# Patient Record
Sex: Female | Born: 1970 | Hispanic: No | Marital: Married | Smoking: Never smoker
Health system: Southern US, Community
[De-identification: ages and names within clinical notes are randomized; demographics above are authoritative.]

---

## 2009-09-02 ENCOUNTER — Encounter (INDEPENDENT_AMBULATORY_CARE_PROVIDER_SITE_OTHER): Payer: Self-pay | Admitting: Family Medicine

## 2009-09-02 ENCOUNTER — Ambulatory Visit: Payer: Self-pay | Admitting: Family Medicine

## 2009-09-02 LAB — CONVERTED CEMR LAB
ALT: 8 units/L (ref 0–35)
AST: 15 units/L (ref 0–37)
BUN: 7 mg/dL (ref 6–23)
Basophils Absolute: 0 10*3/uL (ref 0.0–0.1)
Basophils Relative: 1 % (ref 0–1)
CO2: 23 meq/L (ref 19–32)
CRP: 0 mg/dL (ref <0.6)
Chloride: 105 meq/L (ref 96–112)
Creatinine, Ser: 0.66 mg/dL (ref 0.40–1.20)
Lymphocytes Relative: 49 % — ABNORMAL HIGH (ref 12–46)
Lymphs Abs: 3.1 10*3/uL (ref 0.7–4.0)
Monocytes Absolute: 0.5 10*3/uL (ref 0.1–1.0)
Monocytes Relative: 7 % (ref 3–12)
Neutro Abs: 2.7 10*3/uL (ref 1.7–7.7)
RDW: 13.3 % (ref 11.5–15.5)
Sed Rate: 3 mm/hr (ref 0–22)
Sodium: 141 meq/L (ref 135–145)
Total Bilirubin: 0.5 mg/dL (ref 0.3–1.2)
Total Protein: 7.8 g/dL (ref 6.0–8.3)
Uric Acid, Serum: 2 mg/dL — ABNORMAL LOW (ref 2.4–7.0)
hCG, Beta Chain, Quant, S: <2 milliintl units/mL

## 2009-09-06 ENCOUNTER — Ambulatory Visit (HOSPITAL_COMMUNITY): Admission: RE | Admit: 2009-09-06 | Discharge: 2009-09-06 | Payer: Self-pay | Admitting: Internal Medicine

## 2009-09-10 ENCOUNTER — Ambulatory Visit (HOSPITAL_COMMUNITY): Admission: RE | Admit: 2009-09-10 | Discharge: 2009-09-10 | Payer: Self-pay | Admitting: Family Medicine

## 2009-09-30 ENCOUNTER — Ambulatory Visit: Payer: Self-pay | Admitting: Internal Medicine

## 2009-10-21 ENCOUNTER — Ambulatory Visit: Payer: Self-pay | Admitting: Internal Medicine

## 2009-12-24 ENCOUNTER — Ambulatory Visit: Payer: Self-pay | Admitting: Family Medicine

## 2010-01-28 ENCOUNTER — Encounter (INDEPENDENT_AMBULATORY_CARE_PROVIDER_SITE_OTHER): Payer: Self-pay | Admitting: Family Medicine

## 2010-10-09 ENCOUNTER — Inpatient Hospital Stay (INDEPENDENT_AMBULATORY_CARE_PROVIDER_SITE_OTHER)
Admission: RE | Admit: 2010-10-09 | Discharge: 2010-10-09 | Disposition: A | Discharge status: Home / Self Care | Payer: Self-pay | Source: Ambulatory Visit | Attending: Family Medicine | Admitting: Family Medicine

## 2010-10-09 DIAGNOSIS — H1045 Other chronic allergic conjunctivitis: Secondary | ICD-10-CM

## 2014-03-13 ENCOUNTER — Encounter (HOSPITAL_COMMUNITY): Payer: Self-pay | Admitting: Family Medicine

## 2014-03-13 ENCOUNTER — Emergency Department (HOSPITAL_COMMUNITY)
Admission: EM | Admit: 2014-03-13 | Discharge: 2014-03-13 | Disposition: A | Discharge status: Home / Self Care | Payer: No Typology Code available for payment source | Source: Home / Self Care | Attending: Family Medicine | Admitting: Family Medicine

## 2014-03-13 ENCOUNTER — Ambulatory Visit (HOSPITAL_COMMUNITY): Payer: No Typology Code available for payment source | Attending: Family Medicine

## 2014-03-13 DIAGNOSIS — M542 Cervicalgia: Secondary | ICD-10-CM

## 2014-03-13 DIAGNOSIS — M5412 Radiculopathy, cervical region: Secondary | ICD-10-CM

## 2014-03-13 DIAGNOSIS — M79641 Pain in right hand: Secondary | ICD-10-CM | POA: Insufficient documentation

## 2014-03-13 DIAGNOSIS — S46811A Strain of other muscles, fascia and tendons at shoulder and upper arm level, right arm, initial encounter: Secondary | ICD-10-CM

## 2014-03-13 MED ORDER — PREDNISONE 10 MG PO KIT: PACK | ORAL | Status: AC

## 2014-03-13 MED ORDER — METHOCARBAMOL 500 MG PO TABS: 500.0000 mg | ORAL_TABLET | Freq: Four times a day (QID) | ORAL | Status: AC | PRN

## 2014-03-13 NOTE — ED Notes (Signed)
Pain in right neck, upper right back, and right hand numbness.  Patient reports a 3 month history of symptoms.  Has tried aleve and tylenol.  Patient has been to clinic on [address].  They offered medicine, but patient wants xrays, not more medicine.

## 2014-03-13 NOTE — Discharge Instructions (Signed)
Your pain is likely from cervical radiculopathy and trapezius muscle strain The nerves coming from the neck are irritated causing yoru symptoms Please start the rehab exercises for the neck and trapezius  Apply heat and massage daily Take the muscle relaxer for relief. This may make you tired Please take the prednisone for inflammation The steroids will also likely help you with your ear Follow up with your regular doctor in 2-4 weeks if youy are not better.  Cervical Strain and Sprain (Whiplash) with Rehab Cervical strain and sprain are injuries that commonly occur with "whiplash" injuries. Whiplash occurs when the neck is forcefully whipped backward or forward, such as during a motor vehicle accident or during contact sports. The muscles, ligaments, tendons, discs, and nerves of the neck are susceptible to injury when this occurs. RISK FACTORS Risk of having a whiplash injury increases if:  Osteoarthritis of the spine.  Situations that make head or neck accidents or trauma more likely.  High-risk sports (football, rugby, wrestling, hockey, auto racing, gymnastics, diving, contact karate, or boxing).  Poor strength and flexibility of the neck.  Previous neck injury.  Poor tackling technique.  Improperly fitted or padded equipment. SYMPTOMS   Pain or stiffness in the front or back of neck or both.  Symptoms may present immediately or up to 24 hours after injury.  Dizziness, headache, nausea, and vomiting.  Muscle spasm with soreness and stiffness in the neck.  Tenderness and swelling at the injury site. PREVENTION  Learn and use proper technique (avoid tackling with the head, spearing, and head-butting; use proper falling techniques to avoid landing on the head).  Warm up and stretch properly before activity.  Maintain physical fitness:  Strength, flexibility, and endurance.  Cardiovascular fitness.  Wear properly fitted and padded protective equipment, such as  padded soft collars, for participation in contact sports. PROGNOSIS  Recovery from cervical strain and sprain injuries is dependent on the extent of the injury. These injuries are usually curable in 1 week to 3 months with appropriate treatment.  RELATED COMPLICATIONS   Temporary numbness and weakness may occur if the nerve roots are damaged, and this may persist until the nerve has completely healed.  Chronic pain due to frequent recurrence of symptoms.  Prolonged healing, especially if activity is resumed too soon (before complete recovery). TREATMENT  Treatment initially involves the use of ice and medication to help reduce pain and inflammation. It is also important to perform strengthening and stretching exercises and modify activities that worsen symptoms so the injury does not get worse. These exercises may be performed at home or with a therapist. For patients who experience severe symptoms, a soft, padded collar may be recommended to be worn around the neck.  Improving your posture may help reduce symptoms. Posture improvement includes pulling your chin and abdomen in while sitting or standing. If you are sitting, sit in a firm chair with your buttocks against the back of the chair. While sleeping, try replacing your pillow with a small towel rolled to 2 inches in diameter, or use a cervical pillow or soft cervical collar. Poor sleeping positions delay healing.  For patients with nerve root damage, which causes numbness or weakness, the use of a cervical traction apparatus may be recommended. Surgery is rarely necessary for these injuries. However, cervical strain and sprains that are present at birth (congenital) may require surgery. MEDICATION   If pain medication is necessary, nonsteroidal anti-inflammatory medications, such as aspirin and ibuprofen, or other minor pain relievers,  such as acetaminophen, are often recommended.  Do not take pain medication for 7 days before  surgery.  Prescription pain relievers may be given if deemed necessary by your caregiver. Use only as directed and only as much as you need. HEAT AND COLD:   Cold treatment (icing) relieves pain and reduces inflammation. Cold treatment should be applied for 10 to 15 minutes every 2 to 3 hours for inflammation and pain and immediately after any activity that aggravates your symptoms. Use ice packs or an ice massage.  Heat treatment may be used prior to performing the stretching and strengthening activities prescribed by your caregiver, physical therapist, or athletic trainer. Use a heat pack or a warm soak. SEEK MEDICAL CARE IF:   Symptoms get worse or do not improve in 2 weeks despite treatment.  New, unexplained symptoms develop (drugs used in treatment may produce side effects). EXERCISES RANGE OF MOTION (ROM) AND STRETCHING EXERCISES - Cervical Strain and Sprain These exercises may help you when beginning to rehabilitate your injury. In order to successfully resolve your symptoms, you must improve your posture. These exercises are designed to help reduce the forward-head and rounded-shoulder posture which contributes to this condition. Your symptoms may resolve with or without further involvement from your physician, physical therapist or athletic trainer. While completing these exercises, remember:   Restoring tissue flexibility helps normal motion to return to the joints. This allows healthier, less painful movement and activity.  An effective stretch should be held for at least 20 seconds, although you may need to begin with shorter hold times for comfort.  A stretch should never be painful. You should only feel a gentle lengthening or release in the stretched tissue. STRETCH- Axial Extensors  Lie on your back on the floor. You may bend your knees for comfort. Place a rolled-up hand towel or dish towel, about 2 inches in diameter, under the part of your head that makes contact with the  floor.  Gently tuck your chin, as if trying to make a "double chin," until you feel a gentle stretch at the base of your head.  Hold __________ seconds. Repeat __________ times. Complete this exercise __________ times per day.  STRETCH - Axial Extension   Stand or sit on a firm surface. Assume a good posture: chest up, shoulders drawn back, abdominal muscles slightly tense, knees unlocked (if standing) and feet hip width apart.  Slowly retract your chin so your head slides back and your chin slightly lowers. Continue to look straight ahead.  You should feel a gentle stretch in the back of your head. Be certain not to feel an aggressive stretch since this can cause headaches later.  Hold for __________ seconds. Repeat __________ times. Complete this exercise __________ times per day. STRETCH - Cervical Side Bend   Stand or sit on a firm surface. Assume a good posture: chest up, shoulders drawn back, abdominal muscles slightly tense, knees unlocked (if standing) and feet hip width apart.  Without letting your nose or shoulders move, slowly tip your right / left ear to your shoulder until your feel a gentle stretch in the muscles on the opposite side of your neck.  Hold __________ seconds. Repeat __________ times. Complete this exercise __________ times per day. STRETCH - Cervical Rotators   Stand or sit on a firm surface. Assume a good posture: chest up, shoulders drawn back, abdominal muscles slightly tense, knees unlocked (if standing) and feet hip width apart.  Keeping your eyes level with the ground,  slowly turn your head until you feel a gentle stretch along the back and opposite side of your neck.  Hold __________ seconds. Repeat __________ times. Complete this exercise __________ times per day. RANGE OF MOTION - Neck Circles   Stand or sit on a firm surface. Assume a good posture: chest up, shoulders drawn back, abdominal muscles slightly tense, knees unlocked (if standing) and  feet hip width apart.  Gently roll your head down and around from the back of one shoulder to the back of the other. The motion should never be forced or painful.  Repeat the motion 10-20 times, or until you feel the neck muscles relax and loosen. Repeat __________ times. Complete the exercise __________ times per day. STRENGTHENING EXERCISES - Cervical Strain and Sprain These exercises may help you when beginning to rehabilitate your injury. They may resolve your symptoms with or without further involvement from your physician, physical therapist, or athletic trainer. While completing these exercises, remember:   Muscles can gain both the endurance and the strength needed for everyday activities through controlled exercises.  Complete these exercises as instructed by your physician, physical therapist, or athletic trainer. Progress the resistance and repetitions only as guided.  You may experience muscle soreness or fatigue, but the pain or discomfort you are trying to eliminate should never worsen during these exercises. If this pain does worsen, stop and make certain you are following the directions exactly. If the pain is still present after adjustments, discontinue the exercise until you can discuss the trouble with your clinician. STRENGTH - Cervical Flexors, Isometric  Face a wall, standing about 6 inches away. Place a small pillow, a ball about 6-8 inches in diameter, or a folded towel between your forehead and the wall.  Slightly tuck your chin and gently push your forehead into the soft object. Push only with mild to moderate intensity, building up tension gradually. Keep your jaw and forehead relaxed.  Hold 10 to 20 seconds. Keep your breathing relaxed.  Release the tension slowly. Relax your neck muscles completely before you start the next repetition. Repeat __________ times. Complete this exercise __________ times per day. STRENGTH- Cervical Lateral Flexors, Isometric   Stand  about 6 inches away from a wall. Place a small pillow, a ball about 6-8 inches in diameter, or a folded towel between the side of your head and the wall.  Slightly tuck your chin and gently tilt your head into the soft object. Push only with mild to moderate intensity, building up tension gradually. Keep your jaw and forehead relaxed.  Hold 10 to 20 seconds. Keep your breathing relaxed.  Release the tension slowly. Relax your neck muscles completely before you start the next repetition. Repeat __________ times. Complete this exercise __________ times per day. STRENGTH - Cervical Extensors, Isometric   Stand about 6 inches away from a wall. Place a small pillow, a ball about 6-8 inches in diameter, or a folded towel between the back of your head and the wall.  Slightly tuck your chin and gently tilt your head back into the soft object. Push only with mild to moderate intensity, building up tension gradually. Keep your jaw and forehead relaxed.  Hold 10 to 20 seconds. Keep your breathing relaxed.  Release the tension slowly. Relax your neck muscles completely before you start the next repetition. Repeat __________ times. Complete this exercise __________ times per day. POSTURE AND BODY MECHANICS CONSIDERATIONS - Cervical Strain and Sprain Keeping correct posture when sitting, standing or completing your  activities will reduce the stress put on different body tissues, allowing injured tissues a chance to heal and limiting painful experiences. The following are general guidelines for improved posture. Your physician or physical therapist will provide you with any instructions specific to your needs. While reading these guidelines, remember:  The exercises prescribed by your provider will help you have the flexibility and strength to maintain correct postures.  The correct posture provides the optimal environment for your joints to work. All of your joints have less wear and tear when properly  supported by a spine with good posture. This means you will experience a healthier, less painful body.  Correct posture must be practiced with all of your activities, especially prolonged sitting and standing. Correct posture is as important when doing repetitive low-stress activities (typing) as it is when doing a single heavy-load activity (lifting). PROLONGED STANDING WHILE SLIGHTLY LEANING FORWARD When completing a task that requires you to lean forward while standing in one place for a long time, place either foot up on a stationary 2- to 4-inch high object to help maintain the best posture. When both feet are on the ground, the low back tends to lose its slight inward curve. If this curve flattens (or becomes too large), then the back and your other joints will experience too much stress, fatigue more quickly, and can cause pain.  RESTING POSITIONS Consider which positions are most painful for you when choosing a resting position. If you have pain with flexion-based activities (sitting, bending, stooping, squatting), choose a position that allows you to rest in a less flexed posture. You would want to avoid curling into a fetal position on your side. If your pain worsens with extension-based activities (prolonged standing, working overhead), avoid resting in an extended position such as sleeping on your stomach. Most people will find more comfort when they rest with their spine in a more neutral position, neither too rounded nor too arched. Lying on a non-sagging bed on your side with a pillow between your knees, or on your back with a pillow under your knees will often provide some relief. Keep in mind, being in any one position for a prolonged period of time, no matter how correct your posture, can still lead to stiffness. WALKING Walk with an upright posture. Your ears, shoulders, and hips should all line up. OFFICE WORK When working at a desk, create an environment that supports good, upright  posture. Without extra support, muscles fatigue and lead to excessive strain on joints and other tissues. CHAIR:  A chair should be able to slide under your desk when your back makes contact with the back of the chair. This allows you to work closely.  The chair's height should allow your eyes to be level with the upper part of your monitor and your hands to be slightly lower than your elbows.  Body position:  Your feet should make contact with the floor. If this is not possible, use a foot rest.  Keep your ears over your shoulders. This will reduce stress on your neck and low back. Document Released: 04/13/2005 Document Revised: 08/28/2013 Document Reviewed: 07/26/2008 [facility] Patient Information 2015 [place], [place]. This information is not intended to replace advice given to you by your health care provider. Make sure you discuss any questions you have with your health care provider.  Trapezius Palsy  with Rehab The trapezius is a large muscle of the upper back that helps to control the shoulder blade (scapula) and stabilize the spine.  Trapezius palsy is a condition affecting the nervous system in the trapezius muscle. The condition results in pain and weakness in the back of the shoulder and upper back. Shoulder function is also decreased, because the scapula contains the socket for "ball and-socket" joint of the shoulder. Trapezius palsy is caused by injury to the spinal accessory nerve, which connects to the trapezius muscle. SYMPTOMS   Pain that is achy and in the shoulder and/or upper back.  Decreased shoulder function and/or strength.  Scapula protrudes (winging of the scapula).  Back pain when sitting in a chair with a hard back due to the scapula winging and placing pressure on the back of the chair.  Shrinkage (atrophy) of the trapezius muscle, this may or may not make the neckline look asymmetric.  Shoulder drooping. CAUSES  Trapezius palsy is caused by damage to the  spinal accessory nerve, which is connected to the trapezius muscle. This condition is often associated with acromioclavicular [facility](AC) or sternoclavicular subluxation (adjacent bones becoming out of proper alignment, but the joint surfaces are still touching). Common mechanisms of injury include:  Direct trauma to the shoulder (like being hit with an object or falling on the shoulder).  Complication of previous surgery that causes nerve damage. RISK INCREASES WITH:  Contact sports (i.e., football, rugby, or lacrosse).  Surgery around the neck.  Poor strength and flexibility. PREVENTION   Warm up and stretch properly before activity.  Maintain physical fitness:  Strength, flexibility, and endurance.  Cardiovascular fitness. PROGNOSIS  Trapezius palsy usually resolves spontaneously within 3 to 6 months. Nonsurgical (conservative) treatments may help decrease the severity of symptoms.  RELATED COMPLICATIONS   Permanent nerve damage, including pain, numbness, tingling, or weakness.  Inability to compete at a high level.  Recurrent symptoms that result in a chronic problem.  Shoulder stiffness. TREATMENT Treatment initially involves resting from any activities that aggravate the symptoms, and the use of ice and medications to help reduce pain and inflammation. The use of strengthening and stretching exercises may help reduce pain with activity. These exercises may be performed at home or with referral to a therapist. A therapist may recommend further treatment, such as:  The use of electric current to simulate the nerves (transcutaneous electronic nerve stimulation [TENS]).  Ultrasound. If symptoms are severe, your caregiver may recommend you wear a brace to decrease discomfort. If symptoms persist for more than 6 months despite nonsurgical treatment, surgery may be recommended (uncommon). MEDICATION   If pain medication is necessary, then nonsteroidal anti-inflammatory medications,  such as aspirin and ibuprofen, or other minor pain relievers, such as acetaminophen, are often recommended.  Do not take pain medication for 7 days before surgery.  Prescription pain relievers may be given if deemed necessary by your caregiver. Use only as directed and only as much as you need. HEAT AND COLD  Cold treatment (icing) relieves pain and reduces inflammation. Cold treatment should be applied for 10 to 15 minutes every 2 to 3 hours for inflammation and pain and immediately after any activity that aggravates your symptoms. Use ice packs or massage the area with a piece of ice (ice massage).  Heat treatment may be used prior to performing the stretching and strengthening activities prescribed by your caregiver, physical therapist, or athletic trainer. Use a heat pack or soak your injury in warm water. SEEK MEDICAL CARE IF:  Treatment seems to offer no benefit, or the condition worsens.  Any medications produce adverse side effects. EXERCISES RANGE OF MOTION (ROM) AND  STRETCHING EXERCISES - Trapezius Palsy (Spinal Accessory Nerve Palsy) These exercises may help you when beginning to rehabilitate your injury. Your symptoms may resolve with or without further involvement from your physician, physical therapist or athletic trainer. While completing these exercises, remember:   Restoring tissue flexibility helps normal motion to return to the joints. This allows healthier, less painful movement and activity.  An effective stretch should be held for at least 30 seconds.  A stretch should never be painful. You should only feel a gentle lengthening or release in the stretched tissue. STRETCH - Flexion, Standing  Stand with good posture. With an underhand grip on your right / left and an overhand grip on the opposite hand, grasp a broomstick or cane so that your hands are a little more than shoulder-width apart.  Keeping your right / left elbow straight and shoulder muscles relaxed, push  the stick with your opposite hand to raise your right / left arm in front of your body and then overhead. Raise your arm until you feel a stretch in your right / left shoulder, but before you have increased shoulder pain.  Try to avoid shrugging your right / left shoulder as your arm rises by keeping your shoulder blade tucked down and toward your mid-back spine. Hold __________ seconds.  Slowly return to the starting position. Repeat __________ times. Complete this exercise __________ times per day.  STRETCH - Abduction, Supine  Stand with good posture. With an underhand grip on your right / left and an overhand grip on the opposite hand, grasp a broomstick or cane so that your hands are a little more than shoulder-width apart.  Keeping your right / left elbow straight and shoulder muscles relaxed, push the stick with your opposite hand to raise your right / left arm out to the side of your body and then overhead. Raise your arm until you feel a stretch in your right / left shoulder, but before you have increased shoulder pain.  Try to avoid shrugging your right / left shoulder as your arm rises by keeping your shoulder blade tucked down and toward your mid-back spine. Hold __________ seconds.  Slowly return to the starting position. Repeat __________ times. Complete this exercise __________ times per day.  ROM - Flexion, Active-Assisted  Lie on your back. You may bend your knees for comfort.  Grasp a broomstick or cane so your hands are about shoulder-width apart. Your right / left hand should grip the end of the stick/cane so that your hand is positioned "thumbs-up," as if you were about to shake hands.  Using your healthy arm to lead, raise your right / left arm overhead until you feel a gentle stretch in your shoulder. Hold __________ seconds.  Use the stick/cane to assist in returning your right / left arm to its starting position. Repeat __________ times. Complete this exercise  __________ times per day.  STRETCH - External Rotation and Abduction  Stagger your stance through a doorframe. It does not matter which foot is forward.  Choose one of the following positions as instructed by your physician, physical therapist or athletic trainer: place your hands:  and forearms above your head and on the door frame.  and forearms at head-height and on the door frame.  at elbow-height and on the door frame.  Keeping your head and chest upright and your stomach muscles tight to prevent over-extending your low-back, slowly shift your weight onto your front foot until you feel a stretch across your chest  and/or in the front of your shoulders.  Hold __________ seconds. Shift your weight to your back foot to release the stretch. Repeat __________ times. Complete this stretch __________ times per day.  STRENGTHENING EXERCISES - Trapezius Palsy (Spinal Accessory Nerve Palsy) These exercises may help you when beginning to rehabilitate your injury. They may resolve your symptoms with or without further involvement from your physician, physical therapist or athletic trainer. While completing these exercises, remember:   Muscles can gain both the endurance and the strength needed for everyday activities through controlled exercises.  Complete these exercises as instructed by your physician, physical therapist or athletic trainer. Progress with the resistance and repetition exercises only as your caregiver advises.  You may experience muscle soreness or fatigue, but the pain or discomfort you are trying to eliminate should never worsen during these exercises. If this pain does worsen, stop and make certain you are following the directions exactly. If the pain is still present after adjustments, discontinue the exercise until you can discuss the trouble with your clinician. STRENGTH - Scapular Depression and Adduction  With good posture, sit on a firm chair. Support your arms in front  of you with pillows, arm rests or a table top. Have your elbows in line with the sides of your body.  Gently draw your shoulder blades down and toward your mid-back spine. Gradually increase the tension without tensing the muscles along the top of your shoulders and the back of your neck.  Hold for __________ seconds. Slowly release the tension and relax your muscles completely before completing the next repetition.  After you have practiced this exercise, remove the arm support and complete it while standing as well as sitting. Repeat __________ times. Complete this exercise __________ times per day.  STRENGTH - Shoulder Abductors, Isometric   With good posture, stand or sit about 4-6 inches from a wall with your right / left side facing the wall.  Bend your right / left elbow. Gently press your right / left elbow into the wall. Increase the pressure gradually until you are pressing as hard as you can without shrugging your shoulder or increasing any shoulder discomfort.  Hold __________ seconds.  Release the tension slowly. Relax your shoulder muscles completely before you do the next repetition. Repeat __________ times. Complete this exercise __________ times per day.  STRENGTH - Shoulder Flexion, Isometric  With good posture and facing a wall, stand or sit about 4-6 inches away.  Keeping your right / left elbow straight, gently press the top of your fist into the wall. Increase the pressure gradually until you are pressing as hard as you can without shrugging your shoulder or increasing any shoulder discomfort.  Hold __________ seconds.  Release the tension slowly. Relax your shoulder muscles completely before you do the next repetition. Repeat __________ times. Complete this exercise __________ times per day.  STRENGTH - Internal Rotators  Secure a rubber exercise band/tubing to a fixed object so that it is at the same height as your right / left elbow when you are standing or  sitting on a firm surface.  Stand or sit so that the secured exercise band/tubing is at your right / left side.  Bend your elbow 90 degrees. Place a folded towel or small pillow under your right / left arm so that your elbow is a few inches away from your side.  Keeping the tension on the exercise band/tubing, pull it across your body toward your abdomen. Be sure to keep your  body steady so that the movement is only coming from your shoulder rotating.  Hold __________ seconds. Release the tension in a controlled manner as you return to the starting position. Repeat __________ times. Complete this exercise __________ times per day.  STRENGTH - External Rotators  Secure a rubber exercise band/tubing to a fixed object so that it is at the same height as your right / left elbow when you are standing or sitting on a firm surface.  Stand or sit so that the secured exercise band/tubing is at your side that is not injured.  Bend your elbow 90 degrees. Place a folded towel or small pillow under your right / left arm so that your elbow is a few inches away from your side.  Keeping the tension on the exercise band/tubing, pull it away from your body, as if pivoting on your elbow. Be sure to keep your body steady so that the movement is only coming from your shoulder rotating.  Hold __________ seconds. Release the tension in a controlled manner as you return to the starting position. Repeat __________ times. Complete this exercise __________ times per day.  STRENGTH - Shoulder Extensors  Secure a rubber exercise band/tubing so that it is at the height of your shoulders when you are either standing or sitting on a firm arm-less chair.  With a thumbs-up grip, grasp an end of the band/tubing in each hand. Straighten your elbows and lift your hands straight in front of you at shoulder height. Step back away from the secured end of band/tubing until it becomes tense.  Squeezing your shoulder blades  together, pull your hands down to the sides of your thighs. Do not allow your hands to go behind you.  Hold for __________ seconds. Slowly ease the tension on the band/tubing as you reverse the directions and return to the starting position. Repeat __________ times. Complete this exercise __________ times per day.  STRENGTH - Shoulder Extensors, Prone  Lie on your stomach on a firm surface so that your right / left arm overhangs the edge. Rest your forehead on your opposite forearm. With your thumb facing away from your body and your elbow straight, hold a __________ weight in your hand.  Squeeze your right / left shoulder blade to your mid-back spine and then slowly raise your arm behind you to the height of the bed.  Hold for __________ seconds. Slowly reverse the directions and return to the starting position, controlling the weight as you lower your arm. Repeat __________ times. Complete this exercise __________ times per day.  STRENGTH - Horizontal Abductors Choose one of the two oppositions to complete this exercise. Prone (lying on stomach):  Lie on your stomach on a firm surface so that your right / left arm overhangs the edge. Rest your forehead on your opposite forearm. With your palm facing the floor and your elbow straight, hold a __________ weight in your hand.  Squeeze your right / left shoulder blade to your mid-back spine and then slowly raise your arm to the height of the bed.  Hold for __________ seconds. Slowly reverse the directions and return to the starting position, controlling the weight as you lower your arm. Repeat __________ times. Complete this exercise __________ times per day. Standing:   Secure a rubber exercise band/tubing so that it is at the height of your shoulders when you are either standing or sitting on a firm arm-less chair.  Grasp an end of the band/tubing in each hand and have  your palms face each other. Straighten your elbows and lift your hands  straight in front of you at shoulder height. Step back away from the secured end of band/tubing until it becomes tense.  Squeeze your shoulder blades together. Keeping your elbows locked and your hands at shoulder-height, bring your hands out to your side.  Hold __________ seconds. Slowly ease the tension on the band/tubing as you reverse the directions and return to the starting position. Repeat __________ times. Complete this exercise __________ times per day. STRENGTH - Scapular Retractors and Elevators  Secure a rubber exercise band/tubing so that it is at the height of your shoulders when you are either standing or sitting on a firm arm-less chair.  With a thumbs-up grip, grasp an end of the band/tubing in each hand. Step back away from the secured end of band/tubing until it becomes tense.  Squeezing your shoulder blades together, straighten your elbows and lift your hands straight over your head.  Hold for __________ seconds. Slowly ease the tension on the band/tubing as you reverse the directions and return to the starting position. Repeat __________ times. Complete this exercise __________ times per day.  Document Released: 04/13/2005 Document Revised: 08/28/2013 Document Reviewed: 07/26/2008 [facility] Patient Information 2015 [place], [place]. This information is not intended to replace advice given to you by your health care provider. Make sure you discuss any questions you have with your health care provider.

## 2014-03-13 NOTE — ED Provider Notes (Signed)
CSN: [id number]     Arrival date & time 03/13/14  1745 History   First MD Initiated Contact with Patient 03/13/14 1902     Chief Complaint  Patient presents with  . Neck Pain  . Arm Pain   (Consider location/radiation/quality/duration/timing/severity/associated sxs/prior Treatment) HPI  Developed R upper neck and shoulder pain 3-4 mo ago. Went to PCP office avbout 4 wks ago and told to use tylenol adn aleve w/ temporary benefit. Burning. Comes and goes. Arm movements and neck movements make is worse. No history of neck trauma. Pain radiates down R arm. Strength preserved.     History reviewed. No pertinent past medical history. History reviewed. No pertinent past surgical history. No family history on file. History  Substance Use Topics  . Smoking status: Never Smoker   . Smokeless tobacco: Not on file  . Alcohol Use: No   OB History    No data available     Review of Systems Per HPI with all other pertinent systems negative.   Allergies  Review of patient's allergies indicates no known allergies.  Home Medications   Prior to Admission medications   Medication Sig Start Date End Date Taking? Authorizing Provider  acetaminophen (TYLENOL) 325 MG tablet Take 650 mg by mouth every 6 (six) hours as needed.   Yes Historical Provider, MD  Naproxen Sodium (ALEVE PO) Take by mouth.   Yes Historical Provider, MD  methocarbamol (ROBAXIN) 500 MG tablet Take 1-2 tablets (500-1,000 mg total) by mouth every 6 (six) hours as needed for muscle spasms. 03/13/14   Waldemar Dickens, MD  PredniSONE 10 MG KIT 12 day dose pack 03/13/14   Waldemar Dickens, MD   BP 132/76 mmHg  Pulse 69  Temp(Src) 98.1 F (36.7 C) (Oral)  Resp 16  SpO2 100%  LMP 02/19/2014 Physical Exam  Constitutional: She is oriented to person, place, and time. She appears well-developed and well-nourished. No distress.  HENT:  Head: Normocephalic and atraumatic.  Eyes: EOM are normal. Pupils are equal, round, and  reactive to light.  Neck: Normal range of motion.  Cardiovascular: Normal rate and normal heart sounds.   No murmur heard. Pulmonary/Chest: Effort normal and breath sounds normal. No respiratory distress.  Abdominal: Soft.  Musculoskeletal:  Neck FROm Arms FROM No ttp Spurlings + on R Trapezius tight and ttp on R  Neurological: She is alert and oriented to person, place, and time.  Skin: Skin is warm. She is not diaphoretic.  Psychiatric: Her behavior is normal. Judgment and thought content normal.    ED Course  Procedures (including critical care time) Labs Review Labs Reviewed - No data to display  Imaging Review Dg Cervical Spine Complete  03/13/2014   CLINICAL DATA:  Right-sided neck pain and right hand pain for 3-4 weeks  EXAM: CERVICAL SPINE  4+ VIEWS  COMPARISON:  None.  FINDINGS: Seven cervical segments are well visualized. Mild osteophytic changes are noted at C5-6. The neural foramina are widely patent bilaterally. No acute fracture or acute facet abnormality is seen. The odontoid is within normal limits. No gross soft tissue abnormality is noted.  IMPRESSION: Mild cervical degenerative change.  No acute abnormality is noted.   Electronically Signed   By: Inez Catalina M.D.   On: 03/13/2014 19:54     MDM   1. Cervical radiculopathy   2. Neck pain   3. Trapezius strain, right, initial encounter    Plain film w/ only mild ddd Start neck exercises and trap  exercises Steroid pack Robaxin Heat and massage F/u PCP in 2-4 weeks  Precautions given and all questions answered  Linna Darner, MD Family Medicine 03/13/2014, 8:12 PM    Steroids robaxin  Waldemar Dickens, MD 03/13/14 2012

## 2014-05-25 ENCOUNTER — Other Ambulatory Visit: Payer: Self-pay

## 2014-05-25 DIAGNOSIS — Z1231 Encounter for screening mammogram for malignant neoplasm of breast: Secondary | ICD-10-CM

## 2014-05-30 ENCOUNTER — Other Ambulatory Visit: Payer: Self-pay

## 2014-05-30 ENCOUNTER — Ambulatory Visit
Admission: RE | Admit: 2014-05-30 | Discharge: 2014-05-30 | Disposition: A | Discharge status: Home / Self Care | Payer: No Typology Code available for payment source | Source: Ambulatory Visit

## 2014-05-30 DIAGNOSIS — Z1231 Encounter for screening mammogram for malignant neoplasm of breast: Secondary | ICD-10-CM

## 2015-07-23 ENCOUNTER — Other Ambulatory Visit: Payer: Self-pay

## 2015-07-23 DIAGNOSIS — Z1231 Encounter for screening mammogram for malignant neoplasm of breast: Secondary | ICD-10-CM

## 2015-07-31 ENCOUNTER — Ambulatory Visit
Admission: RE | Admit: 2015-07-31 | Discharge: 2015-07-31 | Disposition: A | Discharge status: Home / Self Care | Payer: BLUE CROSS/BLUE SHIELD | Source: Ambulatory Visit

## 2015-07-31 DIAGNOSIS — Z1231 Encounter for screening mammogram for malignant neoplasm of breast: Secondary | ICD-10-CM

## 2015-08-05 ENCOUNTER — Other Ambulatory Visit: Payer: Self-pay | Admitting: Family

## 2015-08-05 DIAGNOSIS — R928 Other abnormal and inconclusive findings on diagnostic imaging of breast: Secondary | ICD-10-CM

## 2015-08-12 ENCOUNTER — Ambulatory Visit
Admission: RE | Admit: 2015-08-12 | Discharge: 2015-08-12 | Disposition: A | Discharge status: Home / Self Care | Payer: BLUE CROSS/BLUE SHIELD | Source: Ambulatory Visit | Attending: Family | Admitting: Family

## 2015-08-12 DIAGNOSIS — R928 Other abnormal and inconclusive findings on diagnostic imaging of breast: Secondary | ICD-10-CM

## 2016-03-22 IMAGING — CR DG CERVICAL SPINE COMPLETE 4+V
5 series · 5 of 5 positions shown · non-contrast
Comparison: None.

CLINICAL DATA: Right-sided neck pain and right hand pain for 3-4
weeks

EXAM:
CERVICAL SPINE  4+ VIEWS

[w c-spine lat]
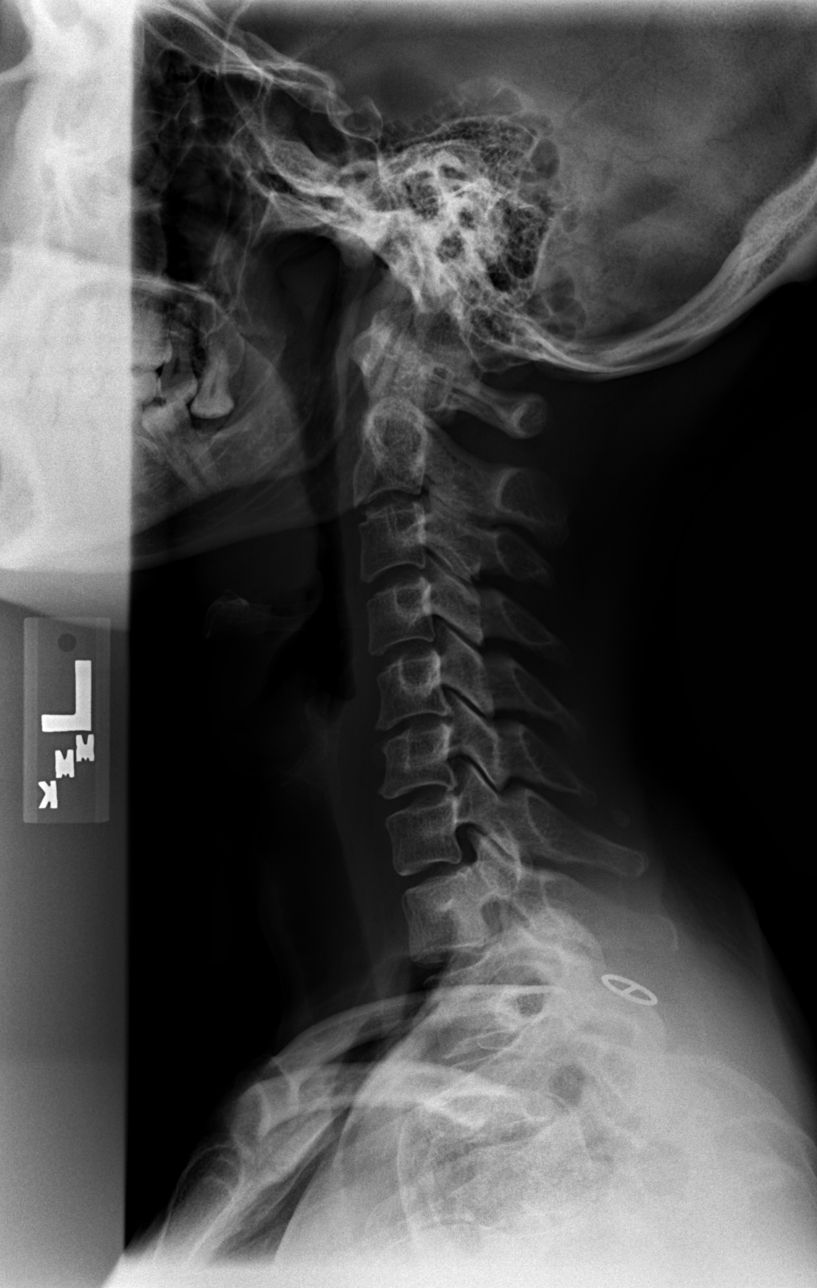

[w c-spine oblique (1 of 2)]
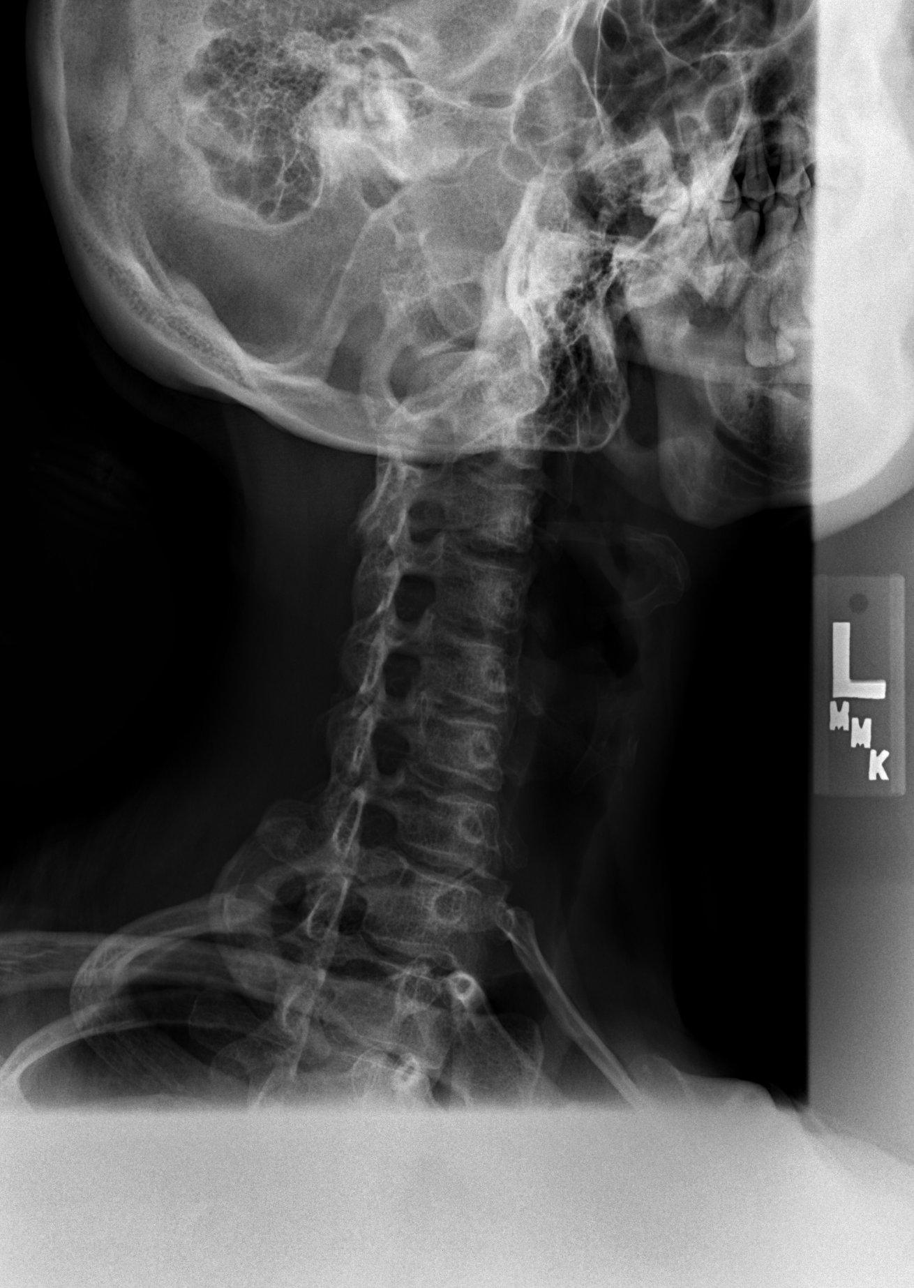

[w c-spine oblique (2 of 2)]
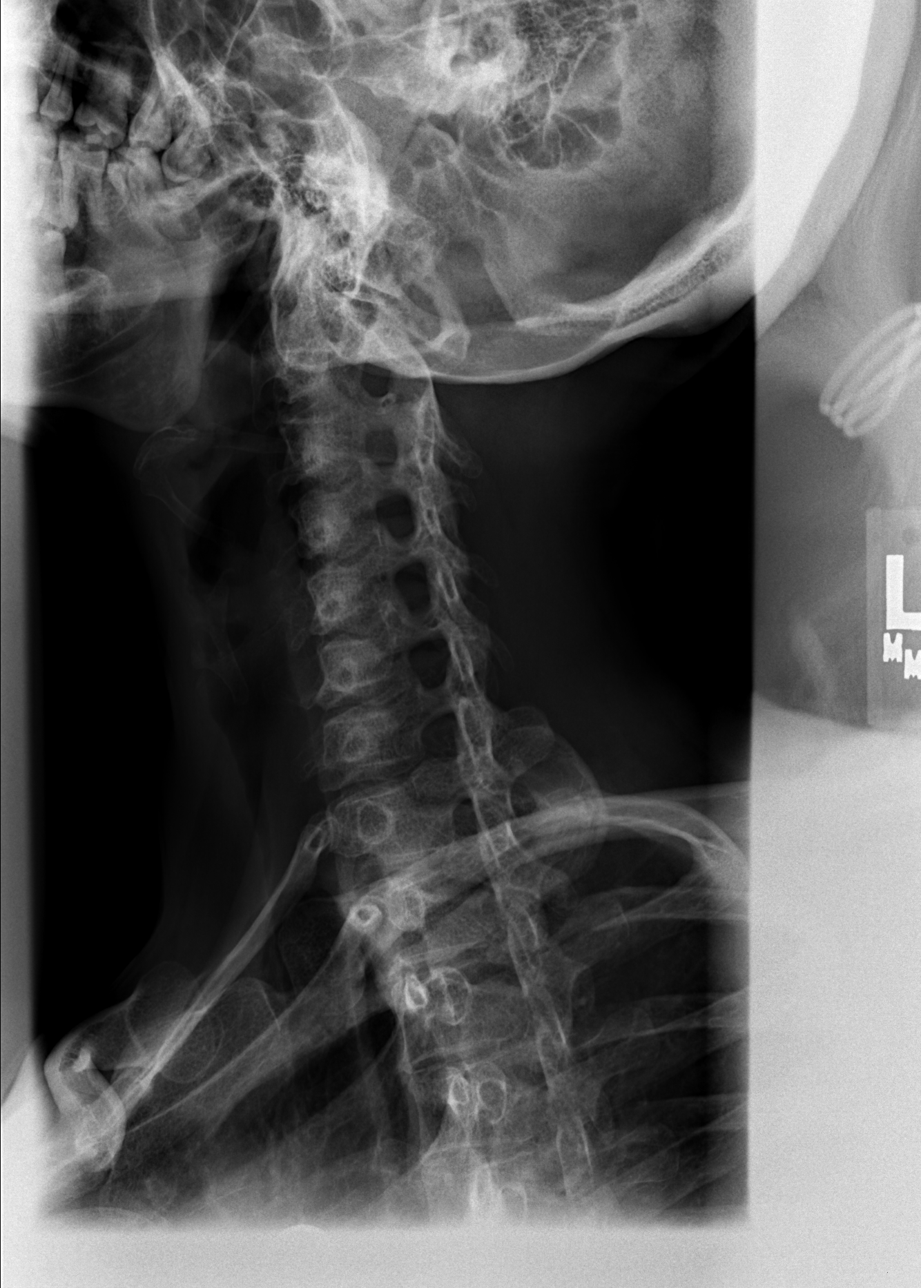

[w c-spine a.p.]
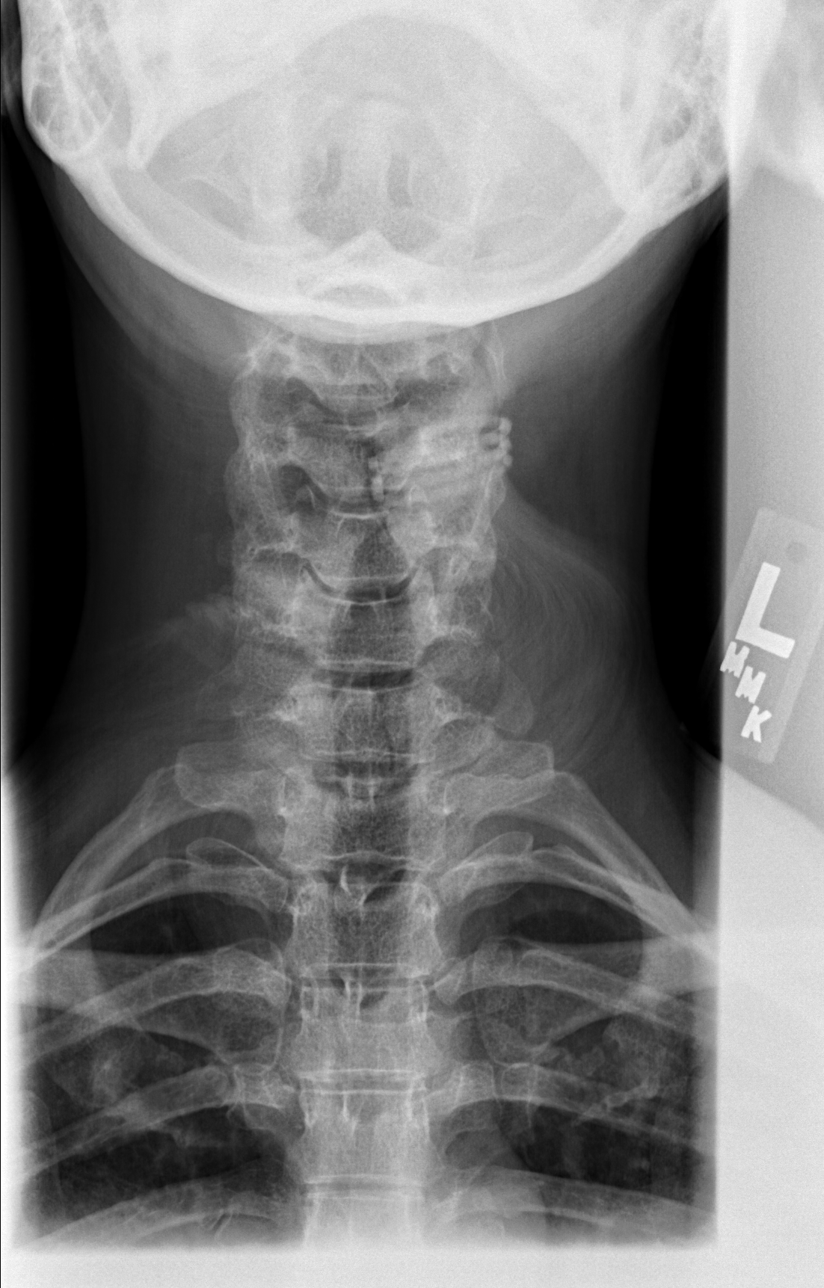

[w c-spine odontoid]
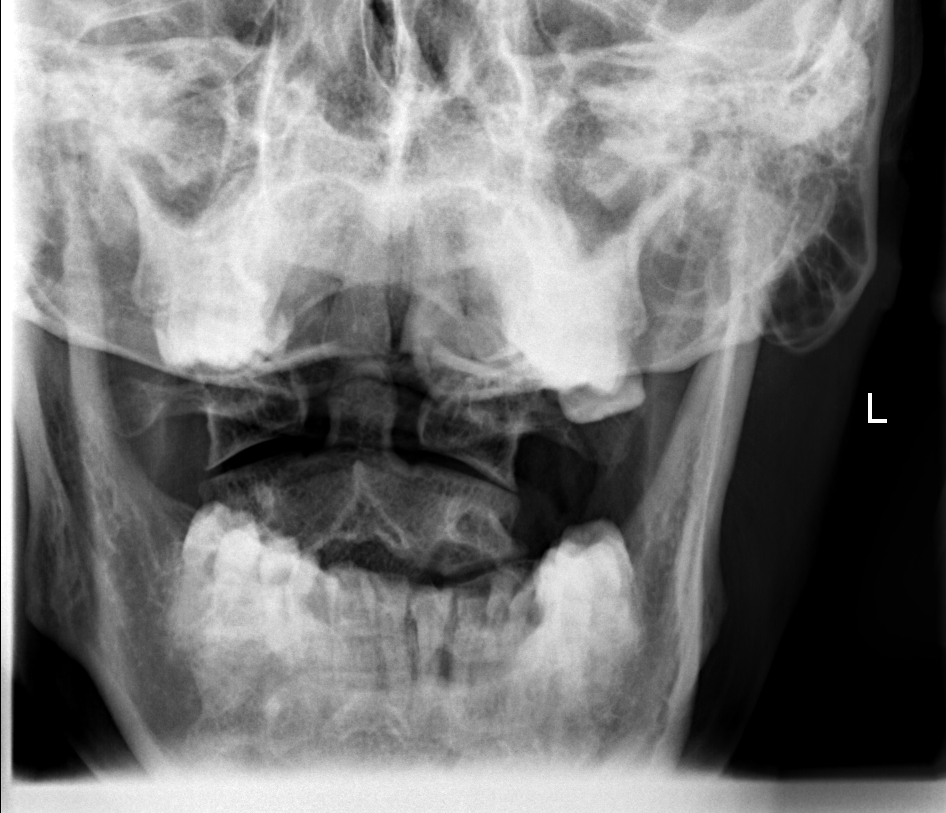

[5 of 5 positions shown; findings below may reference images not displayed]

FINDINGS: Seven cervical segments are well visualized. Mild osteophytic
changes are noted at C5-6. The neural foramina are widely patent
bilaterally. No acute fracture or acute facet abnormality is seen.
The odontoid is within normal limits. No gross soft tissue
abnormality is noted.
IMPRESSION: Mild cervical degenerative change.  No acute abnormality is noted.
